# Patient Record
Sex: Male | Born: 1965 | Race: White | Hispanic: No | Marital: Single | Smoking: Former smoker
Health system: Southern US, Community
[De-identification: ages and names within clinical notes are randomized; demographics above are authoritative.]

## PROBLEM LIST (undated history)

## (undated) DIAGNOSIS — H269 Unspecified cataract: Secondary | ICD-10-CM

## (undated) DIAGNOSIS — F419 Anxiety disorder, unspecified: Secondary | ICD-10-CM

## (undated) DIAGNOSIS — F988 Other specified behavioral and emotional disorders with onset usually occurring in childhood and adolescence: Secondary | ICD-10-CM

## (undated) DIAGNOSIS — F32A Depression, unspecified: Secondary | ICD-10-CM

## (undated) DIAGNOSIS — K429 Umbilical hernia without obstruction or gangrene: Secondary | ICD-10-CM

## (undated) DIAGNOSIS — F329 Major depressive disorder, single episode, unspecified: Secondary | ICD-10-CM

## (undated) DIAGNOSIS — E119 Type 2 diabetes mellitus without complications: Secondary | ICD-10-CM

## (undated) DIAGNOSIS — G4730 Sleep apnea, unspecified: Secondary | ICD-10-CM

## (undated) DIAGNOSIS — M1990 Unspecified osteoarthritis, unspecified site: Secondary | ICD-10-CM

## (undated) DIAGNOSIS — E7800 Pure hypercholesterolemia, unspecified: Secondary | ICD-10-CM

## (undated) HISTORY — DX: Unspecified osteoarthritis, unspecified site: M19.90

## (undated) HISTORY — PX: KNEE ARTHROSCOPY: SUR90

## (undated) HISTORY — PX: LAMINECTOMY: SHX219

## (undated) HISTORY — DX: Type 2 diabetes mellitus without complications: E11.9

## (undated) HISTORY — DX: Umbilical hernia without obstruction or gangrene: K42.9

## (undated) HISTORY — PX: APPENDECTOMY: SHX54

## (undated) HISTORY — DX: Unspecified cataract: H26.9

## (undated) HISTORY — DX: Pure hypercholesterolemia, unspecified: E78.00

---

## 2018-03-15 ENCOUNTER — Encounter: Payer: Self-pay | Admitting: Gastroenterology

## 2018-03-20 ENCOUNTER — Encounter: Payer: Self-pay | Admitting: Gastroenterology

## 2018-03-20 ENCOUNTER — Ambulatory Visit (INDEPENDENT_AMBULATORY_CARE_PROVIDER_SITE_OTHER): Payer: BLUE CROSS/BLUE SHIELD | Admitting: Gastroenterology

## 2018-03-20 ENCOUNTER — Other Ambulatory Visit: Payer: Self-pay | Admitting: *Deleted

## 2018-03-20 VITALS — BP 152/84 | HR 72 | Ht 74.0 in | Wt 271.5 lb

## 2018-03-20 DIAGNOSIS — R1310 Dysphagia, unspecified: Secondary | ICD-10-CM | POA: Diagnosis not present

## 2018-03-20 DIAGNOSIS — R109 Unspecified abdominal pain: Secondary | ICD-10-CM

## 2018-03-20 DIAGNOSIS — K429 Umbilical hernia without obstruction or gangrene: Secondary | ICD-10-CM

## 2018-03-20 DIAGNOSIS — K219 Gastro-esophageal reflux disease without esophagitis: Secondary | ICD-10-CM | POA: Insufficient documentation

## 2018-03-20 DIAGNOSIS — R197 Diarrhea, unspecified: Secondary | ICD-10-CM | POA: Diagnosis not present

## 2018-03-20 DIAGNOSIS — R112 Nausea with vomiting, unspecified: Secondary | ICD-10-CM

## 2018-03-20 DIAGNOSIS — R140 Abdominal distension (gaseous): Secondary | ICD-10-CM | POA: Diagnosis not present

## 2018-03-20 MED ORDER — NA SULFATE-K SULFATE-MG SULF 17.5-3.13-1.6 GM/177ML PO SOLN: ORAL | 0 refills | Status: DC

## 2018-03-20 MED ORDER — DICYCLOMINE HCL 20 MG PO TABS: ORAL_TABLET | ORAL | 3 refills | Status: DC

## 2018-03-20 MED ORDER — PANTOPRAZOLE SODIUM 40 MG PO TBEC: 40.0000 mg | DELAYED_RELEASE_TABLET | Freq: Every day | ORAL | 3 refills | Status: DC

## 2018-03-20 NOTE — Progress Notes (Signed)
amb ref °

## 2018-03-20 NOTE — Progress Notes (Signed)
03/20/2018 Levi Barrera [id number] January 27, 1966   HISTORY OF PRESENT ILLNESS: This is a 52 year old male who is new to our practice.  He is actually already scheduled for a colonoscopy with Dr. Havery Moros in December, which was scheduled for screening purposes as he has never had one in the past.  He is here today, however, with several different GI complaints.  He reports what he calls "episodes" of generalized mid abdominal pain, bloating, nausea, followed by diarrhea he says that this has been going on for quite a few years, but the episodes are getting worse and have become more frequent.  He says that they are now occurring daily.  He says he cannot figure anything in particular that he eats that causes this as once again it is occurring on a regular basis at this point.  Michela Pitcher that previously it would occur 1 or 2 times a month and sometimes he would go months at a time without any symptoms.  He says that he will eat lunch at work and then sometimes even before he gets home in the evening his symptoms have started.  He says that his stomach becomes bloated rapidly usually within 30 minutes to 2 hours after eating.  This is followed by passing a lot of gas and then usually one episode of diarrhea.  He says the gas smells awful.  He reports nausea with it.  He says that the bloating makes it hard to catch his breath.  the abdominal pain doubles him over at times.  Denies any abdominal pain between "episodes".  He denies any weight loss.  He denies any black or bloody stools.  He denies any actual vomiting.  He has tried Gas-X, which has not helped and is currently taking a probiotic for the past couple months, which also has not helped.  Has labs performed routinely by his PCP.  Has had no evaluation of this in the past.  He also reports dysphasia.  Says that this initially was occurring sporadically, but now is occurring daily with food and actually with liquids at times as well.  He says that it  feels like it gets clogged at the lower part of his esophagus where it meets the stomach.  He says that usually he will wait a couple of minutes and it will pass.  He also admits to reflux, but is not in any type of reflux medication.  He reports an umbilical hernia that "pops out" frequently.  Says that his PCP referred him to a surgeon in [place], [place], but he has not heard from them and would like a referral for somewhere here in [place].  He is able to reduce the hernia but it becomes sore.   Past Medical History:  Diagnosis Date  . Arthritis   . Diabetes ([place])   . Elevated cholesterol   . Umbilical hernia    Past Surgical History:  Procedure Laterality Date  . APPENDECTOMY    . KNEE ARTHROSCOPY Bilateral   . LAMINECTOMY     L5, L5 S1    reports that he has quit smoking. He has never used smokeless tobacco. He reports that he drinks alcohol. He reports that he does not use drugs. family history includes Arrhythmia in his mother; Arthritis in his mother; Cancer in his paternal grandfather; Diabetes in his mother. No Known Allergies    Outpatient Encounter Medications as of 03/20/2018  Medication Sig  . atorvastatin (LIPITOR) 20 MG tablet Take 1 tablet by mouth  daily.  Marland Kitchen LORazepam (ATIVAN) 0.5 MG tablet Take 0.5 mg by mouth as needed for anxiety.  . meloxicam (MOBIC) 15 MG tablet Take 1 tablet by mouth daily.  . metFORMIN (GLUCOPHAGE) 500 MG tablet Take 1 tablet by mouth 2 (two) times daily.  . Multiple Vitamin (MULTIVITAMIN) tablet Take 1 tablet by mouth daily.  . Probiotic Product (PROBIOTIC-10 PO) Take by mouth daily.  Marland Kitchen zolpidem (AMBIEN CR) 12.5 MG CR tablet Take 1 tablet by mouth daily.   No facility-administered encounter medications on file as of 03/20/2018.      REVIEW OF SYSTEMS  : All other systems reviewed and negative except where noted in the History of Present Illness.   PHYSICAL EXAM: BP (!) 152/84   Pulse 72   Ht 6\' 2"  (1.88 m)   Wt 271 lb 8 oz (123.2  kg)   BMI 34.86 kg/m  General: Well developed white male in no acute distress Head: Normocephalic and atraumatic Eyes:  Sclerae anicteric, conjunctiva pink. Ears: Normal auditory acuity Lungs: Clear throughout to auscultation; no increased WOB. Heart: Regular rate and rhythm; no M/R/G. Abdomen: Soft, non-distended.  BS present.  Umbilical hernia noted that is slightly tender around the area but is reduced currently. Rectal:  Will be done at the time of colonoscopy. Musculoskeletal: Symmetrical with no gross deformities  Skin: No lesions on visible extremities Extremities: No edema  Neurological: Alert oriented x 4, grossly non-focal Psychological:  Alert and cooperative. Normal mood and affect  ASSESSMENT AND PLAN: *52 year old male with several complaints including generalized mid abdominal pain, bloating, diarrhea, nausea.  The symptoms occur in episodes, initially occurring sporadically once a month or less, but now have been occurring daily.  At this point I am unsure what is causing his symptoms.  He has never had colonoscopy and is actually already scheduled for one in December with Dr. Havery Moros.  I think that we should get a CT scan of the abdomen and pelvis with contrast as well.  He has labs performed routinely by his PCP so we will try to obtain his lab results from the past year.  We will try Bentyl 20 mg twice daily and will give IBgard samples and coupons. *Dysphagia/GERD:  No on any acid reducing medication. Reports dysphagia daily to solid food and sometimes liquids.  Will plan for EGD with possible dilation.  Will put him on pantoprazole 40 mg daily. *Umbilial hernia:  I think that it should be repaired as it does protrude often, which patient then is able to reduce but becomes sore.  Will refer to [name].  **The risks, benefits, and alternatives to EGD with dilation and colonoscopy were discussed with the patient and he consents to proceed.    CC:  No ref. provider  found

## 2018-03-20 NOTE — Progress Notes (Signed)
Agree with assessment and plan as outlined.  

## 2018-03-20 NOTE — Patient Instructions (Signed)
We will call you regarding the referral to [facility].  [place], [place]. [phone]   We sent prescriptions to your pharmacy in [place], [place]. 1. Pantoprazole sodium 40 mg 2. Bentyl 20 mg  You have been scheduled for a CT scan of the abdomen and pelvis at [facility] ([address]---this is in the same building as Press photographer).   You are scheduled on 03-28-2018 at 3:30 Pm. You should arrive at :3 15 pm to your appointment time for registration. Please follow the written instructions below on the day of your exam:  WARNING: IF YOU ARE ALLERGIC TO IODINE/X-RAY DYE, PLEASE NOTIFY RADIOLOGY IMMEDIATELY AT [phone]! YOU WILL BE GIVEN A 13 HOUR PREMEDICATION PREP.  1) Do not eat  anything after 11:30 am (4 hours prior to your test) 2) You have been given 2 bottles of oral contrast to drink. The solution may taste better if refrigerated, but do NOT add ice or any other liquid to this solution. Shake well before drinking.    Drink 1 bottle of contrast @ 1:30 pm (2 hours prior to your exam)  Drink 1 bottle of contrast @ 2:30 Pm (1 hour prior to your exam)  You may take any medications as prescribed with a small amount of water, if necessary. If you take any of the following medications: METFORMIN, GLUCOPHAGE, GLUCOVANCE, AVANDAMET, RIOMET, FORTAMET, [place] MET, JANUMET, GLUMETZA or METAGLIP, you MAY be asked to HOLD this medication 48 hours AFTER the exam.  The purpose of you drinking the oral contrast is to aid in the visualization of your intestinal tract. The contrast solution may cause some diarrhea. Depending on your individual set of symptoms, you may also receive an intravenous injection of x-ray contrast/dye. Plan on being at [facility] for 30 minutes or longer, depending on the type of exam you are having performed.  This test typically takes 30-45 minutes to complete.  If you have any questions regarding your exam or if  you need to reschedule, you may call the CT department at [phone] between the hours of 8:00 am and 5:00 pm, Monday-Friday.  ________________________________________________________________________

## 2018-03-26 ENCOUNTER — Telehealth: Payer: Self-pay | Admitting: *Deleted

## 2018-03-26 ENCOUNTER — Telehealth: Payer: Self-pay | Admitting: Gastroenterology

## 2018-03-26 NOTE — Telephone Encounter (Signed)
Hi Pam, Pt called stating that he got a call from [place] but was not sure of who called him. Did you call him by any chance? If so, could you please call him again? Thank you.

## 2018-03-26 NOTE — Telephone Encounter (Signed)
Called [name] and asked if they have an appointment for this patient for an Umbilical hernia? She told the referral coordinator is working on it and they will call the patient when they have an appointment for him.  They will also fax [place] a confirmation of the appointment.

## 2018-03-26 NOTE — Telephone Encounter (Signed)
I called the patient and told him I called [name] about the referral appointment we are trying to get him for the Umbilical hernia.  They are working on it and will call him when they have an appointment.They are also going to fax me a confirmation regarding the appointment.

## 2018-03-28 ENCOUNTER — Ambulatory Visit (INDEPENDENT_AMBULATORY_CARE_PROVIDER_SITE_OTHER)
Admission: RE | Admit: 2018-03-28 | Discharge: 2018-03-28 | Disposition: A | Discharge status: Home / Self Care | Payer: BLUE CROSS/BLUE SHIELD | Source: Ambulatory Visit | Attending: Gastroenterology | Admitting: Gastroenterology

## 2018-03-28 DIAGNOSIS — R112 Nausea with vomiting, unspecified: Secondary | ICD-10-CM

## 2018-03-28 DIAGNOSIS — R1310 Dysphagia, unspecified: Secondary | ICD-10-CM

## 2018-03-28 DIAGNOSIS — R140 Abdominal distension (gaseous): Secondary | ICD-10-CM

## 2018-03-28 DIAGNOSIS — R109 Unspecified abdominal pain: Secondary | ICD-10-CM

## 2018-03-28 DIAGNOSIS — R197 Diarrhea, unspecified: Secondary | ICD-10-CM

## 2018-03-28 MED ORDER — IOPAMIDOL (ISOVUE-300) INJECTION 61%
100.0000 mL | Freq: Once | INTRAVENOUS | Status: AC | PRN
  Administered 2018-03-28: 100 mL via INTRAVENOUS

## 2018-04-04 ENCOUNTER — Telehealth: Payer: Self-pay | Admitting: Gastroenterology

## 2018-04-04 NOTE — Telephone Encounter (Signed)
Pt called to follow up on referral for [name], he said that he is feeling worse.

## 2018-04-09 NOTE — Telephone Encounter (Signed)
Called [name] and asked if they had an appointment for this patient for an umbilical hernia.  I had faxed the office note from [name] PA-C from 03-20-2018 on 03-21-2018. The woman I spoke to said they got my fax.  She made an appointment for him for 04-18-2018 . Levi Barrera is to arrive at 9:30 am.  The patient made notes of the information I gave him.  Levi Barrera thanked me for getting him the appointment.

## 2018-04-18 ENCOUNTER — Ambulatory Visit: Payer: Self-pay | Admitting: Surgery

## 2018-04-18 NOTE — H&P (View-Only) (Signed)
Surgical H&P  CC: Umbilical hernia  HPI: very nice 52 year old gentleman presents with a partially reducible umbilical hernia.  This wasn't present for at least the last 3 years.  He does not think his increase in size but the frequency of it popping out and having be reduced is going up.  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 Which aggravates the hernia.  He diffusely had an open appendectomy when he is in sixth grade but no other abdominal surgeries.  He is a bit hesitant about the use of mesh.he is not a smoker, he quit several years ago.  He is going to have an upper and lower endoscopy for some varying GI complaints and is also hoping to complete surgery by the end of this year.  No Known Allergies  Past Medical History:  Diagnosis Date  . Arthritis   . Diabetes ([place])   . Elevated cholesterol   . Umbilical hernia     Past Surgical History:  Procedure Laterality Date  . APPENDECTOMY    . KNEE ARTHROSCOPY Bilateral   . LAMINECTOMY     L5, L5 S1    Family History  Problem Relation Age of Onset  . Diabetes Mother   . Arthritis Mother   . Arrhythmia Mother   . Cancer Paternal Grandfather        unknown type    Social History   Socioeconomic History  . Marital status: Single    Spouse name: Not on file  . Number of children: 1  . Years of education: Not on file  . Highest education level: Not on file  Occupational History  . Not on file  Social Needs  . Financial resource strain: Not on file  . Food insecurity:    Worry: Not on file    Inability: Not on file  . Transportation needs:    Medical: Not on file    Non-medical: Not on file  Tobacco Use  . Smoking status: Former Research scientist (life sciences)  . Smokeless tobacco: Never Used  Substance and Sexual Activity  . Alcohol use: Yes    Comment: very rare  . Drug use: Never  . Sexual activity: Yes    Partners: Female  Lifestyle  . Physical activity:    Days per week: Not on file   Minutes per session: Not on file  . Stress: Not on file  Relationships  . Social connections:    Talks on phone: Not on file    Gets together: Not on file    Attends religious service: Not on file    Active member of club or organization: Not on file    Attends meetings of clubs or organizations: Not on file    Relationship status: Not on file  Other Topics Concern  . Not on file  Social History Narrative  . Not on file    Current Outpatient Medications on File Prior to Visit  Medication Sig Dispense Refill  . atorvastatin (LIPITOR) 20 MG tablet Take 1 tablet by mouth daily.    Marland Kitchen dicyclomine (BENTYL) 20 MG tablet Take 1 tab by mouth twice daily. 60 tablet 3  . LORazepam (ATIVAN) 0.5 MG tablet Take 0.5 mg by mouth as needed for anxiety.    . meloxicam (MOBIC) 15 MG tablet Take 1 tablet by mouth daily.    . metFORMIN (GLUCOPHAGE) 500 MG tablet Take 1 tablet by mouth 2 (two) times daily.    . Multiple  Vitamin (MULTIVITAMIN) tablet Take 1 tablet by mouth daily.    . Na Sulfate-K Sulfate-Mg Sulf 17.5-3.13-1.6 GM/177ML SOLN Take as directed for colonoscopy. 1 Bottle 0  . pantoprazole (PROTONIX) 40 MG tablet Take 1 tablet (40 mg total) by mouth daily. 30 tablet 3  . Probiotic Product (PROBIOTIC-10 PO) Take by mouth daily.    Marland Kitchen zolpidem (AMBIEN CR) 12.5 MG CR tablet Take 1 tablet by mouth daily.     No current facility-administered medications on file prior to visit.     Review of Systems: a complete, 10pt review of systems was completed with pertinent positives and negatives as documented in the HPI  Physical Exam: There were no vitals filed for this visit. Gen: A&Ox3, no distress  Head: normocephalic, atraumatic Eyes: extraocular motions intact, anicteric.  Neck: supple without mass or thyromegaly Chest: unlabored respirations, symmetrical air entry, clear bilaterally   Cardiovascular: RRR with palpable distal pulses, no pedal edema Abdomen: obese, soft, nondistended, nontender.  No mass or organomegaly. There is a partially reducible umbilical hernia the fascial bridge is palpable and is probably about 1-1.5 cm in diameter.he does have about a 4 cm midline supraumbilical diastasis recti. Extremities: warm, without edema, no deformities  Neuro: grossly intact Psych: appropriate mood and affect, normal insight  Skin: warm and dry   No flowsheet data found.  No flowsheet data found.  No results found for: INR, PROTIME  Imaging: No results found.    A/P: reducible umbilical hernia.  The patient would like to have the most durable and least risk of recurrence operation available, we had a discussion about the use of mesh, if he feels strongly about not using mesh we can do an open approach with a primary repair but the risk of recurrence is slightly higher given his obesity, diastases although the hernia size is relatively small. We discussed the laparoscopic/robotic approach as well including the use of mesh, risk of intra-abdominal injury and unavoidable but small risk of hernia recurrence, general risks of anesthesia etc.  Questions were answered to his satisfaction.  We will get him scheduled for robotic repair with mesh in the next couple weeks.  Romana Juniper, MD [facility], [place] Pager [phone]

## 2018-04-18 NOTE — H&P (Signed)
Surgical H&P  CC: Umbilical hernia  HPI: very nice 52 year old gentleman presents with a partially reducible umbilical hernia.  This wasn't present for at least the last 3 years.  He does not think his increase in size but the frequency of it popping out and having be reduced is going up.  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 Which aggravates the hernia.  He diffusely had an open appendectomy when he is in sixth grade but no other abdominal surgeries.  He is a bit hesitant about the use of mesh.he is not a smoker, he quit several years ago.  He is going to have an upper and lower endoscopy for some varying GI complaints and is also hoping to complete surgery by the end of this year.  No Known Allergies  Past Medical History:  Diagnosis Date  . Arthritis   . Diabetes ([place])   . Elevated cholesterol   . Umbilical hernia     Past Surgical History:  Procedure Laterality Date  . APPENDECTOMY    . KNEE ARTHROSCOPY Bilateral   . LAMINECTOMY     L5, L5 S1    Family History  Problem Relation Age of Onset  . Diabetes Mother   . Arthritis Mother   . Arrhythmia Mother   . Cancer Paternal Grandfather        unknown type    Social History   Socioeconomic History  . Marital status: Single    Spouse name: Not on file  . Number of children: 1  . Years of education: Not on file  . Highest education level: Not on file  Occupational History  . Not on file  Social Needs  . Financial resource strain: Not on file  . Food insecurity:    Worry: Not on file    Inability: Not on file  . Transportation needs:    Medical: Not on file    Non-medical: Not on file  Tobacco Use  . Smoking status: Former Research scientist (life sciences)  . Smokeless tobacco: Never Used  Substance and Sexual Activity  . Alcohol use: Yes    Comment: very rare  . Drug use: Never  . Sexual activity: Yes    Partners: Female  Lifestyle  . Physical activity:    Days per week: Not on file   Minutes per session: Not on file  . Stress: Not on file  Relationships  . Social connections:    Talks on phone: Not on file    Gets together: Not on file    Attends religious service: Not on file    Active member of club or organization: Not on file    Attends meetings of clubs or organizations: Not on file    Relationship status: Not on file  Other Topics Concern  . Not on file  Social History Narrative  . Not on file    Current Outpatient Medications on File Prior to Visit  Medication Sig Dispense Refill  . atorvastatin (LIPITOR) 20 MG tablet Take 1 tablet by mouth daily.    Marland Kitchen dicyclomine (BENTYL) 20 MG tablet Take 1 tab by mouth twice daily. 60 tablet 3  . LORazepam (ATIVAN) 0.5 MG tablet Take 0.5 mg by mouth as needed for anxiety.    . meloxicam (MOBIC) 15 MG tablet Take 1 tablet by mouth daily.    . metFORMIN (GLUCOPHAGE) 500 MG tablet Take 1 tablet by mouth 2 (two) times daily.    . Multiple  Vitamin (MULTIVITAMIN) tablet Take 1 tablet by mouth daily.    . Na Sulfate-K Sulfate-Mg Sulf 17.5-3.13-1.6 GM/177ML SOLN Take as directed for colonoscopy. 1 Bottle 0  . pantoprazole (PROTONIX) 40 MG tablet Take 1 tablet (40 mg total) by mouth daily. 30 tablet 3  . Probiotic Product (PROBIOTIC-10 PO) Take by mouth daily.    Marland Kitchen zolpidem (AMBIEN CR) 12.5 MG CR tablet Take 1 tablet by mouth daily.     No current facility-administered medications on file prior to visit.     Review of Systems: a complete, 10pt review of systems was completed with pertinent positives and negatives as documented in the HPI  Physical Exam: There were no vitals filed for this visit. Gen: A&Ox3, no distress  Head: normocephalic, atraumatic Eyes: extraocular motions intact, anicteric.  Neck: supple without mass or thyromegaly Chest: unlabored respirations, symmetrical air entry, clear bilaterally   Cardiovascular: RRR with palpable distal pulses, no pedal edema Abdomen: obese, soft, nondistended, nontender.  No mass or organomegaly. There is a partially reducible umbilical hernia the fascial bridge is palpable and is probably about 1-1.5 cm in diameter.he does have about a 4 cm midline supraumbilical diastasis recti. Extremities: warm, without edema, no deformities  Neuro: grossly intact Psych: appropriate mood and affect, normal insight  Skin: warm and dry   No flowsheet data found.  No flowsheet data found.  No results found for: INR, PROTIME  Imaging: No results found.    A/P: reducible umbilical hernia.  The patient would like to have the most durable and least risk of recurrence operation available, we had a discussion about the use of mesh, if he feels strongly about not using mesh we can do an open approach with a primary repair but the risk of recurrence is slightly higher given his obesity, diastases although the hernia size is relatively small. We discussed the laparoscopic/robotic approach as well including the use of mesh, risk of intra-abdominal injury and unavoidable but small risk of hernia recurrence, general risks of anesthesia etc.  Questions were answered to his satisfaction.  We will get him scheduled for robotic repair with mesh in the next couple weeks.  Romana Juniper, MD [facility], [place] Pager [phone]

## 2018-04-23 ENCOUNTER — Encounter (HOSPITAL_COMMUNITY)
Admission: RE | Admit: 2018-04-23 | Discharge: 2018-04-23 | Disposition: A | Discharge status: Home / Self Care | Payer: BLUE CROSS/BLUE SHIELD | Source: Ambulatory Visit | Attending: Surgery | Admitting: Surgery

## 2018-04-23 ENCOUNTER — Inpatient Hospital Stay (HOSPITAL_COMMUNITY): Admission: RE | Admit: 2018-04-23 | Payer: BLUE CROSS/BLUE SHIELD | Source: Ambulatory Visit

## 2018-04-23 ENCOUNTER — Ambulatory Visit (AMBULATORY_SURGERY_CENTER): Payer: BLUE CROSS/BLUE SHIELD | Admitting: Gastroenterology

## 2018-04-23 ENCOUNTER — Other Ambulatory Visit: Payer: Self-pay

## 2018-04-23 ENCOUNTER — Encounter (HOSPITAL_COMMUNITY): Payer: Self-pay

## 2018-04-23 ENCOUNTER — Encounter: Payer: Self-pay | Admitting: Gastroenterology

## 2018-04-23 VITALS — BP 140/70 | HR 57 | Temp 98.0°F | Resp 12 | Ht 74.0 in | Wt 271.0 lb

## 2018-04-23 DIAGNOSIS — K429 Umbilical hernia without obstruction or gangrene: Secondary | ICD-10-CM | POA: Diagnosis not present

## 2018-04-23 DIAGNOSIS — I4510 Unspecified right bundle-branch block: Secondary | ICD-10-CM | POA: Diagnosis not present

## 2018-04-23 DIAGNOSIS — K219 Gastro-esophageal reflux disease without esophagitis: Secondary | ICD-10-CM | POA: Diagnosis not present

## 2018-04-23 DIAGNOSIS — D130 Benign neoplasm of esophagus: Secondary | ICD-10-CM | POA: Diagnosis not present

## 2018-04-23 DIAGNOSIS — R194 Change in bowel habit: Secondary | ICD-10-CM

## 2018-04-23 DIAGNOSIS — K5730 Diverticulosis of large intestine without perforation or abscess without bleeding: Secondary | ICD-10-CM | POA: Diagnosis not present

## 2018-04-23 DIAGNOSIS — Z01818 Encounter for other preprocedural examination: Secondary | ICD-10-CM | POA: Insufficient documentation

## 2018-04-23 DIAGNOSIS — D125 Benign neoplasm of sigmoid colon: Secondary | ICD-10-CM

## 2018-04-23 DIAGNOSIS — D127 Benign neoplasm of rectosigmoid junction: Secondary | ICD-10-CM | POA: Diagnosis not present

## 2018-04-23 DIAGNOSIS — R1310 Dysphagia, unspecified: Secondary | ICD-10-CM | POA: Diagnosis not present

## 2018-04-23 DIAGNOSIS — K648 Other hemorrhoids: Secondary | ICD-10-CM

## 2018-04-23 DIAGNOSIS — D123 Benign neoplasm of transverse colon: Secondary | ICD-10-CM | POA: Diagnosis not present

## 2018-04-23 DIAGNOSIS — D128 Benign neoplasm of rectum: Secondary | ICD-10-CM | POA: Diagnosis not present

## 2018-04-23 DIAGNOSIS — R112 Nausea with vomiting, unspecified: Secondary | ICD-10-CM

## 2018-04-23 HISTORY — DX: Sleep apnea, unspecified: G47.30

## 2018-04-23 HISTORY — DX: Other specified behavioral and emotional disorders with onset usually occurring in childhood and adolescence: F98.8

## 2018-04-23 HISTORY — DX: Anxiety disorder, unspecified: F41.9

## 2018-04-23 HISTORY — DX: Major depressive disorder, single episode, unspecified: F32.9

## 2018-04-23 HISTORY — DX: Depression, unspecified: F32.A

## 2018-04-23 LAB — CBC WITH DIFFERENTIAL/PLATELET
Abs Immature Granulocytes: 0.01 10*3/uL (ref 0.00–0.07)
BASOS ABS: 0 10*3/uL (ref 0.0–0.1)
Basophils Relative: 1 %
Eosinophils Absolute: 0.1 10*3/uL (ref 0.0–0.5)
Eosinophils Relative: 2 %
HEMATOCRIT: 45.2 % (ref 39.0–52.0)
Hemoglobin: 15.1 g/dL (ref 13.0–17.0)
Immature Granulocytes: 0 %
LYMPHS ABS: 1.6 10*3/uL (ref 0.7–4.0)
Lymphocytes Relative: 29 %
MCH: 29.9 pg (ref 26.0–34.0)
MCHC: 33.4 g/dL (ref 30.0–36.0)
MCV: 89.5 fL (ref 80.0–100.0)
Monocytes Absolute: 0.4 10*3/uL (ref 0.1–1.0)
Monocytes Relative: 8 %
Neutro Abs: 3.4 10*3/uL (ref 1.7–7.7)
Neutrophils Relative %: 60 %
Platelets: 210 10*3/uL (ref 150–400)
RBC: 5.05 MIL/uL (ref 4.22–5.81)
RDW: 12.5 % (ref 11.5–15.5)
WBC: 5.5 10*3/uL (ref 4.0–10.5)
nRBC: 0 % (ref 0.0–0.2)

## 2018-04-23 LAB — BASIC METABOLIC PANEL
Anion gap: 11 (ref 5–15)
BUN: 12 mg/dL (ref 6–20)
CALCIUM: 9.4 mg/dL (ref 8.9–10.3)
CO2: 25 mmol/L (ref 22–32)
Chloride: 102 mmol/L (ref 98–111)
Creatinine, Ser: 0.79 mg/dL (ref 0.61–1.24)
GFR calc Af Amer: >60 mL/min (ref >60)
GFR calc non Af Amer: >60 mL/min (ref >60)
Glucose, Bld: 136 mg/dL — ABNORMAL HIGH (ref 70–99)
Potassium: 4.3 mmol/L (ref 3.5–5.1)
Sodium: 138 mmol/L (ref 135–145)

## 2018-04-23 LAB — GLUCOSE, CAPILLARY: Glucose-Capillary: 142 mg/dL — ABNORMAL HIGH (ref 70–99)

## 2018-04-23 LAB — HEMOGLOBIN A1C
Hgb A1c MFr Bld: 7.5 % — ABNORMAL HIGH (ref 4.8–5.6)
Mean Plasma Glucose: 168.55 mg/dL

## 2018-04-23 MED ORDER — FLEET ENEMA 7-19 GM/118ML RE ENEM
1.0000 | ENEMA | Freq: Once | RECTAL | Status: AC
  Administered 2018-04-23: 1 via RECTAL

## 2018-04-23 MED ORDER — CHLORHEXIDINE GLUCONATE 4 % EX LIQD
60.0000 mL | Freq: Once | CUTANEOUS | Status: DC
  Filled 2018-04-23: qty 60

## 2018-04-23 MED ORDER — SODIUM CHLORIDE 0.9 % IV SOLN: 500.0000 mL | Freq: Once | INTRAVENOUS | Status: AC

## 2018-04-23 NOTE — Op Note (Addendum)
[place] Patient Name: Levi Barrera Procedure Date: 04/23/2018 2:43 PM MRN: [id number] Endoscopist: Remo Lipps P. Havery Moros , MD Age: 52 Referring MD:  Date of Birth: [birth year] Gender: Male Account #: [id number] Procedure:                Upper GI endoscopy Indications:              Dysphagia, Follow-up of esophageal reflux - on                            protonix now with resolution of symptoms Medicines:                Monitored Anesthesia Care Procedure:                Pre-Anesthesia Assessment:                           - Prior to the procedure, a History and Physical                            was performed, and patient medications and                            allergies were reviewed. The patient's tolerance of                            previous anesthesia was also reviewed. The risks                            and benefits of the procedure and the sedation                            options and risks were discussed with the patient.                            All questions were answered, and informed consent                            was obtained. Prior Anticoagulants: The patient has                            taken no previous anticoagulant or antiplatelet                            agents. ASA Grade Assessment: II - A patient with                            mild systemic disease. After reviewing the risks                            and benefits, the patient was deemed in                            satisfactory condition to undergo the procedure.  After obtaining informed consent, the endoscope was                            passed under direct vision. Throughout the                            procedure, the patient's blood pressure, pulse, and                            oxygen saturations were monitored continuously. The                            Endoscope was introduced through the mouth, and                            advanced to  the second part of duodenum. The upper                            GI endoscopy was accomplished without difficulty.                            The patient tolerated the procedure well. Scope In: Scope Out: Findings:                 Esophagogastric landmarks were identified: the                            Z-line was found at 40 cm, the gastroesophageal                            junction was found at 40 cm and the upper extent of                            the gastric folds was found at 40 cm from the                            incisors.                           The exam of the esophagus was otherwise normal. No                            stenosis / stricture, no Barrett's                           The entire examined stomach was normal.                           A single small nodule was found in the second                            portion of the duodenum. Biopsies were taken with a  cold forceps for histology. The lesion appeared                            possibly cystic, appeared to deflate and flatten                            post biopsy                           The exam of the duodenum was otherwise normal. Complications:            No immediate complications. Estimated blood loss:                            Minimal. Estimated Blood Loss:     Estimated blood loss was minimal. Impression:               - Esophagogastric landmarks identified.                           - Normal esophagus                           - Normal stomach.                           - Benign nodule / possible cyst found in the                            duodenum. Biopsied. Recommendation:           - Patient has a contact number available for                            emergencies. The signs and symptoms of potential                            delayed complications were discussed with the                            patient. Return to normal activities tomorrow.                             Written discharge instructions were provided to the                            patient.                           - Resume previous diet.                           - Continue present medications.                           - Await pathology results. Remo Lipps P. Kambrie Eddleman, MD 04/23/2018 3:52:48 PM This report has been signed electronically.

## 2018-04-23 NOTE — Op Note (Addendum)
[place] Patient Name: Levi Barrera Procedure Date: 04/23/2018 2:43 PM MRN: [id number] Endoscopist: Remo Lipps P. Havery Moros , MD Age: 52 Referring MD:  Date of Birth: [birth year] Gender: Male Account #: [id number] Procedure:                Colonoscopy Indications:              This is the patient's first colonoscopy, Change in                            bowel habits - symptoms have since improved Medicines:                Monitored Anesthesia Care Procedure:                Pre-Anesthesia Assessment:                           - Prior to the procedure, a History and Physical                            was performed, and patient medications and                            allergies were reviewed. The patient's tolerance of                            previous anesthesia was also reviewed. The risks                            and benefits of the procedure and the sedation                            options and risks were discussed with the patient.                            All questions were answered, and informed consent                            was obtained. Prior Anticoagulants: The patient has                            taken no previous anticoagulant or antiplatelet                            agents. ASA Grade Assessment: II - A patient with                            mild systemic disease. After reviewing the risks                            and benefits, the patient was deemed in                            satisfactory condition to undergo the procedure.  After obtaining informed consent, the colonoscope                            was passed under direct vision. Throughout the                            procedure, the patient's blood pressure, pulse, and                            oxygen saturations were monitored continuously. The                            Colonoscope was introduced through the anus and                            advanced  to the the cecum, identified by                            appendiceal orifice and ileocecal valve. The                            colonoscopy was performed without difficulty. The                            patient tolerated the procedure well. The quality                            of the bowel preparation was adequate. The                            ileocecal valve, appendiceal orifice, and rectum                            were photographed. Scope In: 3:02:01 PM Scope Out: 3:33:33 PM Scope Withdrawal Time: 0 hours 27 minutes 25 seconds  Total Procedure Duration: 0 hours 31 minutes 32 seconds  Findings:                 The perianal and digital rectal examinations were                            normal.                           Two flat and sessile polyps were found in the                            transverse colon. The polyps were 3 to 4 mm in                            size. These polyps were removed with a cold snare.                            Resection and retrieval were complete.  A 3 mm polyp was found in the sigmoid colon. The                            polyp was sessile. The polyp was removed with a                            cold snare. Resection and retrieval were complete.                           A diminutive polyp was found in the rectum. The                            polyp was sessile. The polyp was removed with a                            cold biopsy forceps. Resection and retrieval were                            complete.                           Scattered small-mouthed diverticula were found in                            the entire colon.                           Internal hemorrhoids were found during                            retroflexion. The hemorrhoids were small.                           The exam was otherwise without abnormality. Prep                            took several minutes to lavage to achieve adequate                             views.                           Biopsies for histology were taken with a cold                            forceps from the right colon, left colon and                            transverse colon for evaluation of microscopic                            colitis. Complications:            No immediate complications. Estimated blood loss:  Minimal. Estimated Blood Loss:     Estimated blood loss was minimal. Impression:               - Two 3 to 4 mm polyps in the transverse colon,                            removed with a cold snare. Resected and retrieved.                           - One 3 mm polyp in the sigmoid colon, removed with                            a cold snare. Resected and retrieved.                           - One diminutive polyp in the rectum, removed with                            a cold biopsy forceps. Resected and retrieved.                           - Diverticulosis in the entire examined colon.                           - Internal hemorrhoids.                           - The examination was otherwise normal.                           - Biopsies were taken with a cold forceps from the                            right colon, left colon and transverse colon for                            evaluation of microscopic colitis. Recommendation:           - Patient has a contact number available for                            emergencies. The signs and symptoms of potential                            delayed complications were discussed with the                            patient. Return to normal activities tomorrow.                            Written discharge instructions were provided to the                            patient.                           -  Resume previous diet.                           - Continue present medications.                           - Await pathology results. Remo Lipps P. [name], MD 04/23/2018 3:48:55 PM This  report has been signed electronically.

## 2018-04-23 NOTE — Progress Notes (Signed)
Called to room to assist during endoscopic procedure.  Patient ID and intended procedure confirmed with present staff. Received instructions for my participation in the procedure from the performing physician.  

## 2018-04-23 NOTE — Patient Instructions (Signed)
Levi Barrera  04/23/2018   Your procedure is scheduled on: 04/30/2018   Report to [facility] Main  Entrance  Report to admitting at  1030 AM    Call this number if you have problems the morning of surgery [phone]   Remember: Do not eat food or drink liquids :After Midnight. BRUSH YOUR TEETH MORNING OF SURGERY AND RINSE YOUR MOUTH OUT, NO CHEWING GUM CANDY OR MINTS.     Take these medicines the morning of surgery with A SIP OF WATER: Ativan if needed, Protonix  DO NOT TAKE ANY DIABETIC MEDICATIONS DAY OF YOUR SURGERY                               You may not have any metal on your body including hair pins and              piercings  Do not wear jewelry, , lotions, powders or perfumes, deodorant                        Men may shave face and neck.   Do not bring valuables to the hospital. [place].  Contacts, dentures or bridgework may not be worn into surgery.      Patients discharged the day of surgery will not be allowed to drive home.  Name and phone number of your driver:                 Please read over the following fact sheets you were given: _____________________________________________________________________             [facility] - Preparing for Surgery Before surgery, you can play an important role.  Because skin is not sterile, your skin needs to be as free of germs as possible.  You can reduce the number of germs on your skin by washing with CHG (chlorahexidine gluconate) soap before surgery.  CHG is an antiseptic cleaner which kills germs and bonds with the skin to continue killing germs even after washing. Please DO NOT use if you have an allergy to CHG or antibacterial soaps.  If your skin becomes reddened/irritated stop using the CHG and inform your nurse when you arrive at Short Stay. Do not shave (including legs and underarms) for at least 48 hours prior to the first CHG  shower.  You may shave your face/neck. Please follow these instructions carefully:  1.  Shower with CHG Soap the night before surgery and the  morning of Surgery.  2.  If you choose to wash your hair, wash your hair first as usual with your  normal  shampoo.  3.  After you shampoo, rinse your hair and body thoroughly to remove the  shampoo.                           4.  Use CHG as you would any other liquid soap.  You can apply chg directly  to the skin and wash                       Gently with a scrungie or clean washcloth.  5.  Apply the CHG Soap to  your body ONLY FROM THE NECK DOWN.   Do not use on face/ open                           Wound or open sores. Avoid contact with eyes, ears mouth and genitals (private parts).                       Wash face,  Genitals (private parts) with your normal soap.             6.  Wash thoroughly, paying special attention to the area where your surgery  will be performed.  7.  Thoroughly rinse your body with warm water from the neck down.  8.  DO NOT shower/wash with your normal soap after using and rinsing off  the CHG Soap.                9.  Pat yourself dry with a clean towel.            10.  Wear clean pajamas.            11.  Place clean sheets on your bed the night of your first shower and do not  sleep with pets. Day of Surgery : Do not apply any lotions/deodorants the morning of surgery.  Please wear clean clothes to the hospital/surgery center.  FAILURE TO FOLLOW THESE INSTRUCTIONS MAY RESULT IN THE CANCELLATION OF YOUR SURGERY PATIENT SIGNATURE_________________________________  NURSE SIGNATURE__________________________________  __

## 2018-04-23 NOTE — Progress Notes (Signed)
A/ox3, pleased with MAC, report to RN 

## 2018-04-23 NOTE — Patient Instructions (Signed)
Discharge instructions given. Handouts on polyps,diverticulosis and hemorrhoids. Resume previous medications. YOU HAD AN ENDOSCOPIC PROCEDURE TODAY AT THE [facility]:   Refer to the procedure report that was given to you for any specific questions about what was found during the examination.  If the procedure report does not answer your questions, please call your gastroenterologist to clarify.  If you requested that your care partner not be given the details of your procedure findings, then the procedure report has been included in a sealed envelope for you to review at your convenience later.  YOU SHOULD EXPECT: Some feelings of bloating in the abdomen. Passage of more gas than usual.  Walking can help get rid of the air that was put into your GI tract during the procedure and reduce the bloating. If you had a lower endoscopy (such as a colonoscopy or flexible sigmoidoscopy) you may notice spotting of blood in your stool or on the toilet paper. If you underwent a bowel prep for your procedure, you may not have a normal bowel movement for a few days.  Please Note:  You might notice some irritation and congestion in your nose or some drainage.  This is from the oxygen used during your procedure.  There is no need for concern and it should clear up in a day or so.  SYMPTOMS TO REPORT IMMEDIATELY:   Following lower endoscopy (colonoscopy or flexible sigmoidoscopy):  Excessive amounts of blood in the stool  Significant tenderness or worsening of abdominal pains  Swelling of the abdomen that is new, acute  Fever of 100F or higher   Following upper endoscopy (EGD)  Vomiting of blood or coffee ground material  New chest pain or pain under the shoulder blades  Painful or persistently difficult swallowing  New shortness of breath  Fever of 100F or higher  Black, tarry-looking stools  For urgent or emergent issues, a gastroenterologist can be reached at any hour by calling [phone].   DIET:  We do recommend a small meal at first, but then you may proceed to your regular diet.  Drink plenty of fluids but you should avoid alcoholic beverages for 24 hours.  ACTIVITY:  You should plan to take it easy for the rest of today and you should NOT DRIVE or use heavy machinery until tomorrow (because of the sedation medicines used during the test).    FOLLOW UP: Our staff will call the number listed on your records the next business day following your procedure to check on you and address any questions or concerns that you may have regarding the information given to you following your procedure. If we do not reach you, we will leave a message.  However, if you are feeling well and you are not experiencing any problems, there is no need to return our call.  We will assume that you have returned to your regular daily activities without incident.  If any biopsies were taken you will be contacted by phone or by letter within the next 1-3 weeks.  Please call us at [phone] if you have not heard about the biopsies in 3 weeks.    SIGNATURES/CONFIDENTIALITY: You and/or your care partner have signed paperwork which will be entered into your electronic medical record.  These signatures attest to the fact that that the information above on your After Visit Summary has been reviewed and is understood.  Full responsibility of the confidentiality of this discharge information lies with you and/or your care-partner.    

## 2018-04-24 ENCOUNTER — Encounter (HOSPITAL_COMMUNITY): Payer: Self-pay

## 2018-04-24 ENCOUNTER — Telehealth: Payer: Self-pay

## 2018-04-24 NOTE — Progress Notes (Signed)
Final EKG done 04/23/18-epic

## 2018-04-24 NOTE — Telephone Encounter (Signed)
  Follow up Call-  Call back number 04/23/2018  Post procedure Call Back phone  # [phone]  Permission to leave phone message Yes     Patient questions:  Do you have a fever, pain , or abdominal swelling? No. Pain Score  0 *  Have you tolerated food without any problems? Yes.    Have you been able to return to your normal activities? Yes.    Do you have any questions about your discharge instructions: Diet   No. Medications  No. Follow up visit  No.  Do you have questions or concerns about your Care? No.  Actions: * If pain score is 4 or above: No action needed, pain <4.

## 2018-04-29 MED ORDER — DEXTROSE 5 % IV SOLN
3.0000 g | INTRAVENOUS | Status: AC
  Administered 2018-04-30: 3 g via INTRAVENOUS
  Filled 2018-04-29: qty 3

## 2018-04-29 MED ORDER — BUPIVACAINE LIPOSOME 1.3 % IJ SUSP
20.0000 mL | INTRAMUSCULAR | Status: DC
  Filled 2018-04-29: qty 20

## 2018-04-30 ENCOUNTER — Encounter (HOSPITAL_COMMUNITY)
Admission: RE | Disposition: A | Discharge status: Home / Self Care | Payer: Self-pay | Source: Home / Self Care | Attending: Surgery

## 2018-04-30 ENCOUNTER — Ambulatory Visit (HOSPITAL_COMMUNITY): Payer: BLUE CROSS/BLUE SHIELD | Admitting: Anesthesiology

## 2018-04-30 ENCOUNTER — Encounter (HOSPITAL_COMMUNITY): Payer: Self-pay | Admitting: *Deleted

## 2018-04-30 ENCOUNTER — Ambulatory Visit (HOSPITAL_COMMUNITY)
Admission: RE | Admit: 2018-04-30 | Discharge: 2018-04-30 | Disposition: A | Discharge status: Home / Self Care | Payer: BLUE CROSS/BLUE SHIELD | Attending: Surgery | Admitting: Surgery

## 2018-04-30 ENCOUNTER — Encounter: Payer: Self-pay | Admitting: Gastroenterology

## 2018-04-30 DIAGNOSIS — K429 Umbilical hernia without obstruction or gangrene: Secondary | ICD-10-CM | POA: Diagnosis not present

## 2018-04-30 DIAGNOSIS — Z8249 Family history of ischemic heart disease and other diseases of the circulatory system: Secondary | ICD-10-CM | POA: Diagnosis not present

## 2018-04-30 DIAGNOSIS — M1990 Unspecified osteoarthritis, unspecified site: Secondary | ICD-10-CM | POA: Diagnosis not present

## 2018-04-30 DIAGNOSIS — E7800 Pure hypercholesterolemia, unspecified: Secondary | ICD-10-CM | POA: Diagnosis not present

## 2018-04-30 DIAGNOSIS — Z87891 Personal history of nicotine dependence: Secondary | ICD-10-CM | POA: Insufficient documentation

## 2018-04-30 DIAGNOSIS — E119 Type 2 diabetes mellitus without complications: Secondary | ICD-10-CM | POA: Insufficient documentation

## 2018-04-30 DIAGNOSIS — E669 Obesity, unspecified: Secondary | ICD-10-CM | POA: Insufficient documentation

## 2018-04-30 DIAGNOSIS — Z8261 Family history of arthritis: Secondary | ICD-10-CM | POA: Insufficient documentation

## 2018-04-30 DIAGNOSIS — Z791 Long term (current) use of non-steroidal anti-inflammatories (NSAID): Secondary | ICD-10-CM | POA: Diagnosis not present

## 2018-04-30 DIAGNOSIS — K219 Gastro-esophageal reflux disease without esophagitis: Secondary | ICD-10-CM | POA: Insufficient documentation

## 2018-04-30 DIAGNOSIS — Z6834 Body mass index (BMI) 34.0-34.9, adult: Secondary | ICD-10-CM | POA: Insufficient documentation

## 2018-04-30 DIAGNOSIS — Z809 Family history of malignant neoplasm, unspecified: Secondary | ICD-10-CM | POA: Diagnosis not present

## 2018-04-30 DIAGNOSIS — Z833 Family history of diabetes mellitus: Secondary | ICD-10-CM | POA: Insufficient documentation

## 2018-04-30 DIAGNOSIS — Z79899 Other long term (current) drug therapy: Secondary | ICD-10-CM | POA: Diagnosis not present

## 2018-04-30 DIAGNOSIS — Z7984 Long term (current) use of oral hypoglycemic drugs: Secondary | ICD-10-CM | POA: Insufficient documentation

## 2018-04-30 HISTORY — PX: INSERTION OF MESH: SHX5868

## 2018-04-30 HISTORY — PX: XI ROBOTIC ASSISTED VENTRAL HERNIA: SHX6789

## 2018-04-30 LAB — GLUCOSE, CAPILLARY: Glucose-Capillary: 148 mg/dL — ABNORMAL HIGH (ref 70–99)

## 2018-04-30 SURGERY — REPAIR, HERNIA, VENTRAL, ROBOT-ASSISTED
Anesthesia: General | Site: Abdomen

## 2018-04-30 MED ORDER — FENTANYL CITRATE (PF) 100 MCG/2ML IJ SOLN
INTRAMUSCULAR | Status: AC
  Filled 2018-04-30: qty 4

## 2018-04-30 MED ORDER — SODIUM CHLORIDE 0.9% FLUSH: 3.0000 mL | INTRAVENOUS | Status: DC | PRN

## 2018-04-30 MED ORDER — ROCURONIUM BROMIDE 10 MG/ML (PF) SYRINGE
PREFILLED_SYRINGE | INTRAVENOUS | Status: AC
  Filled 2018-04-30: qty 10

## 2018-04-30 MED ORDER — FENTANYL CITRATE (PF) 100 MCG/2ML IJ SOLN: 25.0000 ug | INTRAMUSCULAR | Status: DC | PRN

## 2018-04-30 MED ORDER — DOCUSATE SODIUM 100 MG PO CAPS: 100.0000 mg | ORAL_CAPSULE | Freq: Two times a day (BID) | ORAL | 0 refills | Status: AC

## 2018-04-30 MED ORDER — BUPIVACAINE-EPINEPHRINE (PF) 0.25% -1:200000 IJ SOLN
INTRAMUSCULAR | Status: AC
  Filled 2018-04-30: qty 30

## 2018-04-30 MED ORDER — OXYCODONE HCL 5 MG PO TABS: 5.0000 mg | ORAL_TABLET | ORAL | Status: DC | PRN

## 2018-04-30 MED ORDER — ONDANSETRON HCL 4 MG/2ML IJ SOLN
INTRAMUSCULAR | Status: AC
  Filled 2018-04-30: qty 2

## 2018-04-30 MED ORDER — MIDAZOLAM HCL 5 MG/5ML IJ SOLN
INTRAMUSCULAR | Status: DC | PRN
  Administered 2018-04-30: 2 mg via INTRAVENOUS

## 2018-04-30 MED ORDER — PHENYLEPHRINE 40 MCG/ML (10ML) SYRINGE FOR IV PUSH (FOR BLOOD PRESSURE SUPPORT)
PREFILLED_SYRINGE | INTRAVENOUS | Status: AC
  Filled 2018-04-30: qty 10

## 2018-04-30 MED ORDER — LIDOCAINE HCL (CARDIAC) PF 100 MG/5ML IV SOSY
PREFILLED_SYRINGE | INTRAVENOUS | Status: DC | PRN
  Administered 2018-04-30: 100 mg via INTRAVENOUS

## 2018-04-30 MED ORDER — ONDANSETRON HCL 4 MG/2ML IJ SOLN
INTRAMUSCULAR | Status: DC | PRN
  Administered 2018-04-30: 4 mg via INTRAVENOUS

## 2018-04-30 MED ORDER — SODIUM CHLORIDE 0.9% FLUSH: 3.0000 mL | Freq: Two times a day (BID) | INTRAVENOUS | Status: DC

## 2018-04-30 MED ORDER — OXYCODONE HCL 5 MG/5ML PO SOLN: 5.0000 mg | Freq: Once | ORAL | Status: AC | PRN

## 2018-04-30 MED ORDER — DEXAMETHASONE SODIUM PHOSPHATE 10 MG/ML IJ SOLN
INTRAMUSCULAR | Status: AC
  Filled 2018-04-30: qty 1

## 2018-04-30 MED ORDER — ACETAMINOPHEN 650 MG RE SUPP
650.0000 mg | RECTAL | Status: DC | PRN
  Filled 2018-04-30: qty 1

## 2018-04-30 MED ORDER — LACTATED RINGERS IV SOLN
INTRAVENOUS | Status: DC
  Administered 2018-04-30 (×2): via INTRAVENOUS

## 2018-04-30 MED ORDER — LIDOCAINE 2% (20 MG/ML) 5 ML SYRINGE
INTRAMUSCULAR | Status: AC
  Filled 2018-04-30: qty 5

## 2018-04-30 MED ORDER — ROCURONIUM BROMIDE 100 MG/10ML IV SOLN
INTRAVENOUS | Status: DC | PRN
  Administered 2018-04-30: 20 mg via INTRAVENOUS
  Administered 2018-04-30 (×3): 10 mg via INTRAVENOUS
  Administered 2018-04-30 (×2): 20 mg via INTRAVENOUS
  Administered 2018-04-30: 10 mg via INTRAVENOUS
  Administered 2018-04-30: 60 mg via INTRAVENOUS

## 2018-04-30 MED ORDER — GABAPENTIN 300 MG PO CAPS
300.0000 mg | ORAL_CAPSULE | ORAL | Status: AC
  Administered 2018-04-30: 300 mg via ORAL
  Filled 2018-04-30: qty 1

## 2018-04-30 MED ORDER — FENTANYL CITRATE (PF) 100 MCG/2ML IJ SOLN
INTRAMUSCULAR | Status: DC | PRN
  Administered 2018-04-30 (×2): 50 ug via INTRAVENOUS
  Administered 2018-04-30: 100 ug via INTRAVENOUS

## 2018-04-30 MED ORDER — BUPIVACAINE-EPINEPHRINE 0.25% -1:200000 IJ SOLN
INTRAMUSCULAR | Status: DC | PRN
  Administered 2018-04-30: 20 mL

## 2018-04-30 MED ORDER — DEXAMETHASONE SODIUM PHOSPHATE 10 MG/ML IJ SOLN
INTRAMUSCULAR | Status: DC | PRN
  Administered 2018-04-30: 10 mg via INTRAVENOUS

## 2018-04-30 MED ORDER — SUGAMMADEX SODIUM 500 MG/5ML IV SOLN
INTRAVENOUS | Status: AC
  Filled 2018-04-30: qty 5

## 2018-04-30 MED ORDER — SODIUM CHLORIDE 0.9 % IV SOLN: 250.0000 mL | INTRAVENOUS | Status: DC | PRN

## 2018-04-30 MED ORDER — FENTANYL CITRATE (PF) 100 MCG/2ML IJ SOLN
25.0000 ug | INTRAMUSCULAR | Status: DC | PRN
  Administered 2018-04-30 (×3): 50 ug via INTRAVENOUS

## 2018-04-30 MED ORDER — STERILE WATER FOR IRRIGATION IR SOLN
Status: DC | PRN
  Administered 2018-04-30: 1000 mL

## 2018-04-30 MED ORDER — FENTANYL CITRATE (PF) 250 MCG/5ML IJ SOLN
INTRAMUSCULAR | Status: AC
  Filled 2018-04-30: qty 5

## 2018-04-30 MED ORDER — ONDANSETRON HCL 4 MG/2ML IJ SOLN: 4.0000 mg | Freq: Once | INTRAMUSCULAR | Status: DC | PRN

## 2018-04-30 MED ORDER — OXYCODONE-ACETAMINOPHEN 5-325 MG PO TABS: 1.0000 | ORAL_TABLET | Freq: Four times a day (QID) | ORAL | 0 refills | Status: AC | PRN

## 2018-04-30 MED ORDER — EPHEDRINE 5 MG/ML INJ
INTRAVENOUS | Status: AC
  Filled 2018-04-30: qty 10

## 2018-04-30 MED ORDER — MIDAZOLAM HCL 2 MG/2ML IJ SOLN
INTRAMUSCULAR | Status: AC
  Filled 2018-04-30: qty 2

## 2018-04-30 MED ORDER — OXYCODONE HCL 5 MG PO TABS
5.0000 mg | ORAL_TABLET | Freq: Once | ORAL | Status: AC | PRN
  Administered 2018-04-30: 5 mg via ORAL

## 2018-04-30 MED ORDER — PROPOFOL 10 MG/ML IV BOLUS
INTRAVENOUS | Status: AC
  Filled 2018-04-30: qty 20

## 2018-04-30 MED ORDER — ACETAMINOPHEN 500 MG PO TABS
1000.0000 mg | ORAL_TABLET | ORAL | Status: AC
  Administered 2018-04-30: 1000 mg via ORAL
  Filled 2018-04-30: qty 2

## 2018-04-30 MED ORDER — ACETAMINOPHEN 160 MG/5ML PO SOLN: 325.0000 mg | ORAL | Status: DC | PRN

## 2018-04-30 MED ORDER — MEPERIDINE HCL 50 MG/ML IJ SOLN: 6.2500 mg | INTRAMUSCULAR | Status: DC | PRN

## 2018-04-30 MED ORDER — 0.9 % SODIUM CHLORIDE (POUR BTL) OPTIME
TOPICAL | Status: DC | PRN
  Administered 2018-04-30: 1000 mL

## 2018-04-30 MED ORDER — ACETAMINOPHEN 325 MG PO TABS: 650.0000 mg | ORAL_TABLET | ORAL | Status: DC | PRN

## 2018-04-30 MED ORDER — SUGAMMADEX SODIUM 500 MG/5ML IV SOLN
INTRAVENOUS | Status: DC | PRN
  Administered 2018-04-30: 250 mg via INTRAVENOUS

## 2018-04-30 MED ORDER — ACETAMINOPHEN 325 MG PO TABS: 325.0000 mg | ORAL_TABLET | ORAL | Status: DC | PRN

## 2018-04-30 MED ORDER — LIDOCAINE 20MG/ML (2%) 15 ML SYRINGE OPTIME
INTRAMUSCULAR | Status: DC | PRN
  Administered 2018-04-30: 1.5 mg/kg/h via INTRAVENOUS

## 2018-04-30 MED ORDER — OXYCODONE HCL 5 MG PO TABS
ORAL_TABLET | ORAL | Status: AC
  Filled 2018-04-30: qty 1

## 2018-04-30 MED ORDER — PROPOFOL 10 MG/ML IV BOLUS
INTRAVENOUS | Status: DC | PRN
  Administered 2018-04-30: 200 mg via INTRAVENOUS

## 2018-04-30 MED ORDER — CELECOXIB 200 MG PO CAPS
200.0000 mg | ORAL_CAPSULE | ORAL | Status: AC
  Administered 2018-04-30: 200 mg via ORAL
  Filled 2018-04-30: qty 1

## 2018-04-30 SURGICAL SUPPLY — 48 items
APPLIER CLIP 5 13 M/L LIGAMAX5 (MISCELLANEOUS)
BLADE SURG 15 STRL LF DISP TIS (BLADE) ×2 IMPLANT
BLADE SURG 15 STRL SS (BLADE) ×1
CHLORAPREP W/TINT 26ML (MISCELLANEOUS) ×3 IMPLANT
CLIP APPLIE 5 13 M/L LIGAMAX5 (MISCELLANEOUS) IMPLANT
CLIP VESOLOCK LG 6/CT PURPLE (CLIP) IMPLANT
CLIP VESOLOCK MED LG 6/CT (CLIP) IMPLANT
COVER SURGICAL LIGHT HANDLE (MISCELLANEOUS) ×6 IMPLANT
COVER TIP SHEARS 8 DVNC (MISCELLANEOUS) ×2 IMPLANT
COVER TIP SHEARS 8MM DA VINCI (MISCELLANEOUS) ×1
COVER WAND RF STERILE (DRAPES) ×3 IMPLANT
DECANTER SPIKE VIAL GLASS SM (MISCELLANEOUS) ×3 IMPLANT
DERMABOND ADVANCED (GAUZE/BANDAGES/DRESSINGS) ×1
DERMABOND ADVANCED .7 DNX12 (GAUZE/BANDAGES/DRESSINGS) ×2 IMPLANT
DEVICE TROCAR PUNCTURE CLOSURE (ENDOMECHANICALS) IMPLANT
DRAPE ARM DVNC X/XI (DISPOSABLE) ×8 IMPLANT
DRAPE COLUMN DVNC XI (DISPOSABLE) ×2 IMPLANT
DRAPE DA VINCI XI ARM (DISPOSABLE) ×4
DRAPE DA VINCI XI COLUMN (DISPOSABLE) ×1
ELECT REM PT RETURN 15FT ADLT (MISCELLANEOUS) ×3 IMPLANT
GOWN STRL REUS W/TWL XL LVL3 (GOWN DISPOSABLE) ×9 IMPLANT
GRASPER SUT TROCAR 14GX15 (MISCELLANEOUS) IMPLANT
KIT BASIN OR (CUSTOM PROCEDURE TRAY) ×3 IMPLANT
MESH ULTRAPRO 3X6 7.6X15CM (Mesh General) ×3 IMPLANT
NEEDLE HYPO 22GX1.5 SAFETY (NEEDLE) ×3 IMPLANT
PACK CARDIOVASCULAR III (CUSTOM PROCEDURE TRAY) ×3 IMPLANT
SCISSORS LAP 5X35 DISP (ENDOMECHANICALS) IMPLANT
SEAL CANN UNIV 5-8 DVNC XI (MISCELLANEOUS) ×6 IMPLANT
SEAL XI 5MM-8MM UNIVERSAL (MISCELLANEOUS) ×3
SEALER VESSEL DA VINCI XI (MISCELLANEOUS)
SEALER VESSEL EXT DVNC XI (MISCELLANEOUS) IMPLANT
SLEEVE ADV FIXATION 5X100MM (TROCAR) IMPLANT
SOLUTION ANTI FOG 6CC (MISCELLANEOUS) ×3 IMPLANT
SUT STRATAFIX 1PDS 45CM VIOLET (SUTURE) ×3 IMPLANT
SUT VIC AB 3-0 SH 27 (SUTURE) ×1
SUT VIC AB 3-0 SH 27XBRD (SUTURE) ×2 IMPLANT
SUT VIC AB 4-0 PS2 18 (SUTURE) ×3 IMPLANT
SUT VLOC BARB 180 ABS3/0GR12 (SUTURE) ×6
SUTURE VLOC BRB 180 ABS3/0GR12 (SUTURE) ×4 IMPLANT
SYR 10ML LL (SYRINGE) ×3 IMPLANT
SYR 20CC LL (SYRINGE) ×3 IMPLANT
SYS RETRIEVAL 5MM INZII UNIV (BASKET)
SYSTEM RETRIEVL 5MM INZII UNIV (BASKET) IMPLANT
TOWEL OR 17X26 10 PK STRL BLUE (TOWEL DISPOSABLE) ×3 IMPLANT
TOWEL OR NON WOVEN STRL DISP B (DISPOSABLE) ×3 IMPLANT
TRAY FOLEY MTR SLVR 16FR STAT (SET/KITS/TRAYS/PACK) IMPLANT
TROCAR ADV FIXATION 5X100MM (TROCAR) ×3 IMPLANT
TUBING INSUFFLATION 10FT LAP (TUBING) ×3 IMPLANT

## 2018-04-30 NOTE — Anesthesia Procedure Notes (Signed)
Procedure Name: Intubation Date/Time: 04/30/2018 11:50 AM Performed by: Glory Buff, CRNA Pre-anesthesia Checklist: Patient identified, Emergency Drugs available, Suction available and Patient being monitored Patient Re-evaluated:Patient Re-evaluated prior to induction Oxygen Delivery Method: Circle system utilized Preoxygenation: Pre-oxygenation with 100% oxygen Induction Type: IV induction Ventilation: Mask ventilation without difficulty Laryngoscope Size: Miller and 3 Grade View: Grade II Tube type: Oral Tube size: 7.5 mm Number of attempts: 1 Airway Equipment and Method: Stylet and Oral airway Placement Confirmation: ETT inserted through vocal cords under direct vision,  positive ETCO2 and breath sounds checked- equal and bilateral Secured at: 21 cm Tube secured with: Tape Dental Injury: Teeth and Oropharynx as per pre-operative assessment

## 2018-04-30 NOTE — Transfer of Care (Signed)
Immediate Anesthesia Transfer of Care Note  Patient: Levi Barrera  Procedure(s) Performed: XI ROBOTIC ASSISTED UMBILICAL HERNIA REPAIR (N/A Abdomen) INSERTION OF MESH (Abdomen)  Patient Location: PACU  Anesthesia Type:General  Level of Consciousness: drowsy, patient cooperative and responds to stimulation  Airway & Oxygen Therapy: Patient Spontanous Breathing and Patient connected to face mask oxygen  Post-op Assessment: Report given to RN and Post -op Vital signs reviewed and stable  Post vital signs: Reviewed and stable  Last Vitals:  Vitals Value Taken Time  BP 142/78 04/30/2018  2:18 PM  Temp    Pulse 67 04/30/2018  2:21 PM  Resp 19 04/30/2018  2:21 PM  SpO2 100 % 04/30/2018  2:21 PM  Vitals shown include unvalidated device data.  Last Pain:  Vitals:   04/30/18 1003  TempSrc:   PainSc: (P) 0-No pain      Patients Stated Pain Goal: (P) 3 (44/69/50 7225)  Complications: No apparent anesthesia complications

## 2018-04-30 NOTE — Interval H&P Note (Signed)
History and Physical Interval Note:  04/30/2018 11:19 AM  Levi Barrera  has presented today for surgery, with the diagnosis of umbilial hernia  The various methods of treatment have been discussed with the patient and family. After consideration of risks, benefits and other options for treatment, the patient has consented to  Procedure(s): XI ROBOTIC [place] (N/A) INSERTION OF MESH (N/A) as a surgical intervention .  The patient's history has been reviewed, patient examined, no change in status, stable for surgery.  I have reviewed the patient's chart and labs.  Questions were answered to the patient's satisfaction.     Jefry Lesinski Rich Brave

## 2018-04-30 NOTE — Anesthesia Preprocedure Evaluation (Addendum)
Anesthesia Evaluation  Patient identified by MRN, date of birth, ID band Patient awake    Reviewed: Allergy & Precautions, H&P , NPO status , Patient's Chart, lab work & pertinent test results, reviewed documented beta blocker date and time   Airway Mallampati: II  TM Distance: >3 FB Neck ROM: full    Dental no notable dental hx.    Pulmonary neg pulmonary ROS, former smoker,    Pulmonary exam normal breath sounds clear to auscultation       Cardiovascular Exercise Tolerance: Good negative cardio ROS   Rhythm:regular Rate:Normal     Neuro/Psych negative neurological ROS  negative psych ROS   GI/Hepatic Neg liver ROS, GERD  ,  Endo/Other  negative endocrine ROSdiabetes, Oral Hypoglycemic Agents  Renal/GU negative Renal ROS  negative genitourinary   Musculoskeletal  (+) Arthritis ,   Abdominal   Peds  Hematology negative hematology ROS (+)   Anesthesia Other Findings   Reproductive/Obstetrics negative OB ROS                             Anesthesia Physical Anesthesia Plan  ASA: II  Anesthesia Plan: General   Post-op Pain Management:    Induction: Intravenous and Cricoid pressure planned  PONV Risk Score and Plan: 2 and Treatment may vary due to age or medical condition and Ondansetron  Airway Management Planned: Oral ETT  Additional Equipment:   Intra-op Plan:   Post-operative Plan: Extubation in OR  Informed Consent: I have reviewed the patients History and Physical, chart, labs and discussed the procedure including the risks, benefits and alternatives for the proposed anesthesia with the patient or authorized representative who has indicated his/her understanding and acceptance.   Dental Advisory Given  Plan Discussed with: CRNA, Anesthesiologist and Surgeon  Anesthesia Plan Comments: (  )        Anesthesia Quick Evaluation

## 2018-04-30 NOTE — Anesthesia Postprocedure Evaluation (Signed)
Anesthesia Post Note  Patient: Levi Barrera  Procedure(s) Performed: XI ROBOTIC ASSISTED UMBILICAL HERNIA REPAIR (N/A Abdomen) INSERTION OF MESH (Abdomen)     Patient location during evaluation: PACU Anesthesia Type: General Level of consciousness: awake and alert Pain management: pain level controlled Vital Signs Assessment: post-procedure vital signs reviewed and stable Respiratory status: spontaneous breathing, nonlabored ventilation, respiratory function stable and patient connected to nasal cannula oxygen Cardiovascular status: blood pressure returned to baseline and stable Postop Assessment: no apparent nausea or vomiting Anesthetic complications: no    Last Vitals:  Vitals:   04/30/18 1445 04/30/18 1500  BP: (!) 155/90 (!) 135/96  Pulse: 70 74  Resp: 10 (!) 6  Temp:    SpO2: 94% 100%    Last Pain:  Vitals:   04/30/18 1445  TempSrc:   PainSc: 4                  Rukiya Hodgkins

## 2018-04-30 NOTE — Op Note (Signed)
Operative Note  Levi Barrera  [id number]  [id number]  04/30/2018   Surgeon: Vikki Ports A ConnorMD  Assistant: Carlena Hurl, PA-C  Procedure performed: robotic transabdominal preperitoneal repair of umbilical hernia  Preop diagnosis: umbilical hernia Post-op diagnosis/intraop findings: same, approximately 1.5cm defect with superior midline diastasis  Specimens: no Retained items: no EBL: minimal cc Complications: none  Description of procedure: After obtaining informed consent the patient was taken to the operating room and placed supine on operating room table wheregeneral endotracheal anesthesia was initiated, preoperative antibiotics were administered, SCDs applied, and a formal timeout was performed. The abdomen was prepped in usual sterile fashion. Peritoneal access was gained using a Visiport technique in the left upper quadrant and insufflation to 15 mmHg ensued without incident. Gross inspection revealed no evidence of injury from our entry. Under direct visualization and after infiltration of local, 28 mm trochars were placed in the left hemiabdomen and the entry port was exchanged for a robotic 8 mm trocar. Using sharp dissection, cautery and blunt dissection where indicated, a peritoneal flap was developed spanning from approximately 5 cm to the left of the hernia defect across the midline to the pre-transversalis fascia on the right side. The flap was developed superiorly and inferiorly, taking down the fat of the falciform and dividing the median umbilical ligament with cautery. The umbilical defect was then closed vertically using a running #1 strattifix seizure that began within the diastasis, plicating this to reestablish the midline. A ultra Pro mesh was then brought onto the field and trimmed to 3'' x 4'' and the center of the mesh marked prior to inserting in the abdomen. This was secured to the abdominal wall around the edges and central using a 3-0 Vicryl suture.  The peritoneal flap was then brought back up to cover the mesh and reapproximated to the abdominal wall using running 3-0 vlock suture starting at either side and meeting centrally. The reduced hernia sac was used to patch some small tears in the peritoneal flap. At this juncture all instruments removed, the abdomen desufflated and the trochars removed. The skin incisions were closed with subcuticular Monocryl and Dermabond. The patient was then awakened, extubated and taken to PACU in stable condition.   All counts were correct at the completion of the case.

## 2018-04-30 NOTE — Discharge Instructions (Signed)
HERNIA REPAIR: POST OP INSTRUCTIONS ° °###################################################################### ° °EAT °Gradually transition to a high fiber diet with a fiber supplement over the next few weeks after discharge.  Start with a pureed / full liquid diet (see below) ° °WALK °Walk an hour a day.  Control your pain to do that.   ° °CONTROL PAIN °Control pain so that you can walk, sleep, tolerate sneezing/coughing, and go up/down stairs. ° °HAVE A BOWEL MOVEMENT DAILY °Keep your bowels regular to avoid problems.  OK to try a laxative to override constipation.  OK to use an antidairrheal to slow down diarrhea.  Call if not better after 2 tries ° °CALL IF YOU HAVE PROBLEMS/CONCERNS °Call if you are still struggling despite following these instructions. °Call if you have concerns not answered by these instructions ° °###################################################################### ° ° ° °1. DIET: Follow a light bland diet the first 24 hours after arrival home, such as soup, liquids, crackers, etc.  Be sure to include lots of fluids daily.  Advance to a low fat / high fiber diet over the next few days after surgery.  Avoid fast food or heavy meals the first week as your are more likely to get nauseated.   ° °2. Take your usually prescribed home medications unless otherwise directed. ° °3. PAIN CONTROL: °a. Pain is best controlled by a usual combination of three different methods TOGETHER: °i. Ice/Heat °ii. Over the counter pain medication °iii. Prescription pain medication °b. Most patients will experience some swelling and bruising around the hernia(s) such as the bellybutton, groins, or old incisions.  Ice packs or heating pads (30-60 minutes up to 6 times a day) will help. Use ice for the first few days to help decrease swelling and bruising, then switch to heat to help relax tight/sore spots and speed recovery.  Some people prefer to use ice alone, heat alone, alternating between ice & heat.  Experiment  to what works for you.  Swelling and bruising can take several weeks to resolve.   °c. It is helpful to take an over-the-counter pain medication regularly for the first few weeks.  Choose one of the following that works best for you: °i. Naproxen (Aleve, etc)  Two 220mg tabs twice a day °ii. Ibuprofen (Advil, etc) Three 200mg tabs four times a day (every meal & bedtime) °iii. Acetaminophen (Tylenol, etc) 325-650mg four times a day (every meal & bedtime) °d. A  prescription for pain medication should be given to you upon discharge.  Take your pain medication as prescribed.  °i. If you are having problems/concerns with the prescription medicine (does not control pain, nausea, vomiting, rash, itching, etc), please call us [phone] to see if we need to switch you to a different pain medicine that will work better for you and/or control your side effect better. °ii. If you need a refill on your pain medication, please contact your pharmacy.  They will contact our office to request authorization. Prescriptions will not be filled after 5 pm or on week-ends. ° °4. Avoid getting constipated.  Between the surgery and the pain medications, it is common to experience some constipation.  Increasing fluid intake and taking a fiber supplement (such as Metamucil, Citrucel, FiberCon, MiraLax, etc) 1-2 times a day regularly will usually help prevent this problem from occurring.  A mild laxative (prune juice, Milk of Magnesia, MiraLax, etc) should be taken according to package directions if there are no bowel movements after 48 hours.   ° °5. Wash / shower every   day.  You may shower over the skin glue which is waterproof.    6. Skin glue will flake off after 2 weeks. You may replace a dressing/Band-Aid to cover the incision for comfort if you wish. You may leave the incisions open to air.  You may replace a dressing/Band-Aid to cover an incision for comfort if you wish.  Continue to shower over incision(s) after the dressing  is off.  7. ACTIVITIES as tolerated:   a. You may resume regular (light) daily activities beginning the next day--such as daily self-care, walking, climbing stairs--gradually increasing activities as tolerated.  Control your pain so that you can walk an hour a day.  If you can walk 30 minutes without difficulty, it is safe to try more intense activity such as jogging, treadmill, bicycling, low-impact aerobics, swimming, etc. b. Refrain from the most intensive and strenuous activity such as sit-ups, heavy lifting, contact sports, etc  Refrain from any heavy lifting or straining until 6 weeks after surgery.   c. DO NOT PUSH THROUGH PAIN.  Let pain be your guide: If it hurts to do something, don't do it.  Pain is your body warning you to avoid that activity for another week until the pain goes down. d. You may drive when you are no longer taking prescription pain medication, you can comfortably wear a seatbelt, and you can safely maneuver your car and apply brakes. e. Dennis Bast may have sexual intercourse when it is comfortable.   8. FOLLOW UP in our office a. Please call [name] at [phone] to set up an appointment to see your surgeon in the office for a follow-up appointment approximately 2-3 weeks after your surgery. b. Make sure that you call for this appointment the day you arrive home to insure a convenient appointment time.  9.  If you have disability of FMLA / Family leave forms, please bring the forms to the office for processing.  (do not give to your surgeon).  WHEN TO CALL us [phone]: 1. Poor pain control 2. Reactions / problems with new medications (rash/itching, nausea, etc)  3. Fever over 101.5 F (38.5 C) 4. Inability to urinate 5. Nausea and/or vomiting 6. Worsening swelling or bruising 7. Continued bleeding from incision. 8. Increased pain, redness, or drainage from the incision   The clinic staff is available to answer your questions during regular business hours  (8:30am-5pm).  Please dont hesitate to call and ask to speak to one of our nurses for clinical concerns.   If you have a medical emergency, go to the nearest emergency room or call 911.  A surgeon from [facility] is always on call at the hospitals in [facility], [place], [place], [place], [place]  [address] ?  [address] [address], [place], [place]   [address] MAIN: [phone] ? TOLL FREE: [phone] ? FAX: [phone] [email or web address]

## 2018-05-03 ENCOUNTER — Encounter (HOSPITAL_COMMUNITY): Payer: Self-pay | Admitting: Surgery

## 2018-06-16 ENCOUNTER — Other Ambulatory Visit: Payer: Self-pay | Admitting: Gastroenterology

## 2018-07-02 ENCOUNTER — Emergency Department (HOSPITAL_COMMUNITY): Payer: BLUE CROSS/BLUE SHIELD

## 2018-07-02 ENCOUNTER — Encounter (HOSPITAL_COMMUNITY): Payer: Self-pay | Admitting: Emergency Medicine

## 2018-07-02 ENCOUNTER — Emergency Department (HOSPITAL_COMMUNITY)
Admission: EM | Admit: 2018-07-02 | Discharge: 2018-07-02 | Disposition: A | Discharge status: Home / Self Care | Payer: BLUE CROSS/BLUE SHIELD | Attending: Emergency Medicine | Admitting: Emergency Medicine

## 2018-07-02 ENCOUNTER — Other Ambulatory Visit: Payer: Self-pay

## 2018-07-02 DIAGNOSIS — Z79899 Other long term (current) drug therapy: Secondary | ICD-10-CM | POA: Insufficient documentation

## 2018-07-02 DIAGNOSIS — Z7984 Long term (current) use of oral hypoglycemic drugs: Secondary | ICD-10-CM | POA: Diagnosis not present

## 2018-07-02 DIAGNOSIS — Y9389 Activity, other specified: Secondary | ICD-10-CM | POA: Insufficient documentation

## 2018-07-02 DIAGNOSIS — S161XXA Strain of muscle, fascia and tendon at neck level, initial encounter: Secondary | ICD-10-CM | POA: Diagnosis not present

## 2018-07-02 DIAGNOSIS — E119 Type 2 diabetes mellitus without complications: Secondary | ICD-10-CM | POA: Insufficient documentation

## 2018-07-02 DIAGNOSIS — S0990XA Unspecified injury of head, initial encounter: Secondary | ICD-10-CM | POA: Diagnosis present

## 2018-07-02 DIAGNOSIS — S0003XA Contusion of scalp, initial encounter: Secondary | ICD-10-CM | POA: Insufficient documentation

## 2018-07-02 DIAGNOSIS — M25511 Pain in right shoulder: Secondary | ICD-10-CM | POA: Diagnosis not present

## 2018-07-02 DIAGNOSIS — Y92410 Unspecified street and highway as the place of occurrence of the external cause: Secondary | ICD-10-CM | POA: Diagnosis not present

## 2018-07-02 DIAGNOSIS — Z87891 Personal history of nicotine dependence: Secondary | ICD-10-CM | POA: Diagnosis not present

## 2018-07-02 DIAGNOSIS — Y999 Unspecified external cause status: Secondary | ICD-10-CM | POA: Insufficient documentation

## 2018-07-02 NOTE — Discharge Instructions (Addendum)
Your blood pressure slightly elevated, otherwise your vital signs are within normal limits.  Your oxygen level is 99% on room air.  The CT scan of your head and neck show some mild to moderate arthritis changes, but no acute fracture, and no acute changes involving the skull or the brain area.  There is a 2 cm nodule on your thyroid.  Please have your primary physician recheck this.  The x-ray of your shoulder shows arthritis present, but no other changes or problems.  Please use the Tylenol every 4 hours of the anti-inflammatory pain medicine of your choice.  See your primary physician or return to the emergency department if any changes in your condition, problems, or concerns.
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
## 2018-08-02 ENCOUNTER — Other Ambulatory Visit: Payer: Self-pay | Admitting: Gastroenterology

## 2019-01-03 ENCOUNTER — Other Ambulatory Visit: Payer: Self-pay | Admitting: Gastroenterology

## 2019-05-25 ENCOUNTER — Other Ambulatory Visit: Payer: Self-pay | Admitting: Gastroenterology

## 2019-07-01 ENCOUNTER — Other Ambulatory Visit: Payer: Self-pay | Admitting: Gastroenterology

## 2019-07-25 ENCOUNTER — Other Ambulatory Visit: Payer: Self-pay | Admitting: Gastroenterology

## 2019-08-23 ENCOUNTER — Other Ambulatory Visit: Payer: Self-pay | Admitting: Gastroenterology

## 2019-08-25 ENCOUNTER — Other Ambulatory Visit: Payer: Self-pay | Admitting: Gastroenterology

## 2020-04-22 ENCOUNTER — Ambulatory Visit: Payer: BLUE CROSS/BLUE SHIELD | Admitting: Gastroenterology

## 2020-06-10 ENCOUNTER — Ambulatory Visit: Payer: BC Managed Care – PPO | Admitting: Gastroenterology
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
## 2023-04-03 ENCOUNTER — Encounter: Payer: Self-pay | Admitting: Gastroenterology
